# Patient Record
Sex: Female | Born: 1970 | Race: Black or African American | Hispanic: No | Marital: Married
Health system: Southern US, Community
[De-identification: ages and names within clinical notes are randomized; demographics above are authoritative.]

---

## 2000-12-06 ENCOUNTER — Other Ambulatory Visit: Admission: RE | Admit: 2000-12-06 | Discharge: 2000-12-06 | Payer: Self-pay | Admitting: Obstetrics and Gynecology

## 2001-01-29 ENCOUNTER — Ambulatory Visit (HOSPITAL_COMMUNITY): Admission: RE | Admit: 2001-01-29 | Discharge: 2001-01-29 | Payer: Self-pay | Admitting: Obstetrics and Gynecology

## 2001-01-29 ENCOUNTER — Encounter: Payer: Self-pay | Admitting: Obstetrics and Gynecology

## 2001-04-19 ENCOUNTER — Inpatient Hospital Stay (HOSPITAL_COMMUNITY): Admission: AD | Admit: 2001-04-19 | Discharge: 2001-04-19 | Payer: Self-pay | Admitting: Obstetrics and Gynecology

## 2001-07-03 ENCOUNTER — Inpatient Hospital Stay (HOSPITAL_COMMUNITY): Admission: AD | Admit: 2001-07-03 | Discharge: 2001-07-05 | Payer: Self-pay | Admitting: Obstetrics and Gynecology

## 2002-06-25 ENCOUNTER — Other Ambulatory Visit: Admission: RE | Admit: 2002-06-25 | Discharge: 2002-06-25 | Payer: Self-pay | Admitting: Obstetrics and Gynecology

## 2002-12-30 ENCOUNTER — Inpatient Hospital Stay (HOSPITAL_COMMUNITY): Admission: AD | Admit: 2002-12-30 | Discharge: 2002-12-30 | Payer: Self-pay | Admitting: Obstetrics and Gynecology

## 2003-03-19 ENCOUNTER — Inpatient Hospital Stay (HOSPITAL_COMMUNITY): Admission: AD | Admit: 2003-03-19 | Discharge: 2003-03-21 | Payer: Self-pay | Admitting: Obstetrics and Gynecology

## 2003-06-29 ENCOUNTER — Other Ambulatory Visit: Admission: RE | Admit: 2003-06-29 | Discharge: 2003-06-29 | Payer: Self-pay | Admitting: Obstetrics and Gynecology

## 2006-01-04 ENCOUNTER — Other Ambulatory Visit: Admission: RE | Admit: 2006-01-04 | Discharge: 2006-01-04 | Payer: Self-pay | Admitting: *Deleted

## 2008-01-13 ENCOUNTER — Other Ambulatory Visit: Admission: RE | Admit: 2008-01-13 | Discharge: 2008-01-13 | Payer: Self-pay | Admitting: Family Medicine

## 2009-09-27 ENCOUNTER — Other Ambulatory Visit: Admission: RE | Admit: 2009-09-27 | Discharge: 2009-09-27 | Payer: Self-pay | Admitting: Family Medicine

## 2010-05-10 ENCOUNTER — Ambulatory Visit (HOSPITAL_COMMUNITY): Admission: RE | Admit: 2010-05-10 | Discharge: 2010-05-10 | Payer: Self-pay | Admitting: Obstetrics and Gynecology

## 2010-07-25 ENCOUNTER — Ambulatory Visit (HOSPITAL_COMMUNITY): Admission: RE | Admit: 2010-07-25 | Discharge: 2010-07-25 | Payer: Self-pay | Admitting: Obstetrics and Gynecology

## 2010-08-22 ENCOUNTER — Ambulatory Visit (HOSPITAL_COMMUNITY): Admission: RE | Admit: 2010-08-22 | Discharge: 2010-08-22 | Payer: Self-pay | Admitting: Obstetrics and Gynecology

## 2010-09-18 ENCOUNTER — Inpatient Hospital Stay (HOSPITAL_COMMUNITY)
Admission: AD | Admit: 2010-09-18 | Discharge: 2010-09-18 | Payer: Self-pay | Source: Home / Self Care | Admitting: Obstetrics and Gynecology

## 2010-11-10 ENCOUNTER — Inpatient Hospital Stay (HOSPITAL_COMMUNITY)
Admission: RE | Admit: 2010-11-10 | Discharge: 2010-11-13 | Payer: Self-pay | Source: Home / Self Care | Attending: Obstetrics and Gynecology | Admitting: Obstetrics and Gynecology

## 2010-11-10 ENCOUNTER — Encounter (INDEPENDENT_AMBULATORY_CARE_PROVIDER_SITE_OTHER): Payer: Self-pay | Admitting: Obstetrics and Gynecology

## 2010-11-20 LAB — CBC
HCT: 26.9 % — ABNORMAL LOW (ref 36.0–46.0)
HCT: 26.1 % — ABNORMAL LOW (ref 36.0–46.0)
Hemoglobin: 8.8 g/dL — ABNORMAL LOW (ref 12.0–15.0)
Hemoglobin: 8.5 g/dL — ABNORMAL LOW (ref 12.0–15.0)
MCH: 29 pg (ref 26.0–34.0)
MCH: 29.2 pg (ref 26.0–34.0)
MCHC: 32.7 g/dL (ref 30.0–36.0)
MCHC: 32.6 g/dL (ref 30.0–36.0)
MCV: 88.8 fL (ref 78.0–100.0)
MCV: 89.7 fL (ref 78.0–100.0)
Platelets: 145 10*3/uL — ABNORMAL LOW (ref 150–400)
Platelets: 147 10*3/uL — ABNORMAL LOW (ref 150–400)
RBC: 3.03 MIL/uL — ABNORMAL LOW (ref 3.87–5.11)
RBC: 2.91 MIL/uL — ABNORMAL LOW (ref 3.87–5.11)
RDW: 14.5 % (ref 11.5–15.5)
RDW: 14.7 % (ref 11.5–15.5)
WBC: 12 10*3/uL — ABNORMAL HIGH (ref 4.0–10.5)
WBC: 11.9 10*3/uL — ABNORMAL HIGH (ref 4.0–10.5)

## 2010-11-20 LAB — RH IMMUNE GLOB WKUP(>/=20WKS)(NOT WOMEN'S HOSP)
Fetal Screen: NEGATIVE
Unit division: 0

## 2010-11-25 ENCOUNTER — Encounter: Payer: Self-pay | Admitting: Obstetrics and Gynecology

## 2010-11-27 NOTE — Discharge Summary (Signed)
NAMETAYELOR, OSBORNE               ACCOUNT NO.:  [id number]  MEDICAL RECORD NO.:  [id number]          PATIENT TYPE:  INP  LOCATION:  9146                          FACILITY:  [facility]  PHYSICIAN:  Naima A. Dillard, M.D. DATE OF BIRTH:  10/28/1971  DATE OF ADMISSION:  11/10/2010 DATE OF DISCHARGE:  11/13/2010                              DISCHARGE SUMMARY   ADMITTING DIAGNOSES: 1. Intrauterine pregnancy at 39-2/7 weeks. 2. Persistent breech presentation. 3. Desires tubal sterilization. 4. Borderline blood pressures. 5. Unexplained abnormal AFP.  DISCHARGE DIAGNOSES: 1. Intrauterine pregnancy at 39-2/7 weeks. 2. Breech presentation. 3. Desired permanent sterilization. 4. Anemia without hemodynamic instability.  PROCEDURES: 1. Primary low transverse cesarean section. 2. Bilateral tubal sterilization. 3. Spinal anesthesia.  HOSPITAL COURSE:  Brooke Cook is a 40 year old gravida 6, para 3-0-2-3 at 39-2/7 weeks who presented on the day of admission for scheduled primary cesarean section secondary to persistent breech presentation.  The patient also desired tubal sterilization.  Her pregnancy had been remarkable for: 1. Advanced maternal age. 2. Elevated BMI. 3. Rh negative. 4. History of pregnancy-induced hypertension with previous pregnancy. 5. First trimester vaginal bleeding. 6. Persistent breech presentation. 7. Second trimester UTI. 8. Abnormal AFP. 9. Borderline blood pressure at 35 weeks.  The patient was taken to the operating room where a repeat low transverse cesarean section was performed by Dr. Su Hilt under spinal anesthesia.  Findings were a viable female by the name of Cree, Apgars of 8 and 9, weight was 7 pounds 10 ounces.  Placenta and portions of tubes were sent to Pathology.  Infant was taken to the full-term nursery.  Mother was taken to recovery in good condition.  By postop day #1, patient was doing well.  She was up ad lib, tolerating a regular diet.  She  is having no syncope or dizziness and denied any postpartum depression.  Her hemoglobin was 8.8 down to 11.1, white blood cell count was 12, and platelet count was 153.  Her dressing was clean, dry, and intact.  She has had mild edema.  Orthostatic blood pressures were within normal limits.  The patient was breast-feeding and supplementing. She was placed on iron supplement.  Rest of her hospital stay was uncomplicated.  By postop day #3, she was doing well, up ad lib again, no syncope or dizziness.  Incision was clean, dry, and intact with Steri- Strips and subcuticular sutures were in place.  Infant was feeding well at the breast.  The patient still has 1+ edema of her lower extremities but was voiding without difficulty.  She was deemed to have received full benefit of her hospital stay and was discharged home in stable condition. Discharge instructions per [facility] handout.  DISCHARGE MEDICATIONS:  Motrin 600 mg p.o. q.6 h. p.r.n. pain, Percocet 5/325 one to two p.o. q.3-4 hours p.r.n. pain, iron supplement 325 mg one p.o. daily.  Discharge followup will occur in 6 weeks in [facility] or p.r.n.     Brooke Cook, C.N.M.   ______________________________ Pierre Bali Normand Sloop, M.D.    Leeanne Mannan  D:  11/13/2010  T:  11/13/2010  Job:  [id number]  Electronically Signed by Nigel Bridgeman C.N.M. on 11/17/2010 05:11:32 PM Electronically Signed by Jaymes Graff M.D. on 11/27/2010 02:59:30 PM

## 2011-01-15 LAB — RPR: RPR Ser Ql: NONREACTIVE

## 2011-01-15 LAB — CBC
HCT: 34 % — ABNORMAL LOW (ref 36.0–46.0)
Hemoglobin: 11.1 g/dL — ABNORMAL LOW (ref 12.0–15.0)
MCH: 29.1 pg (ref 26.0–34.0)
MCHC: 32.6 g/dL (ref 30.0–36.0)
MCV: 89 fL (ref 78.0–100.0)
Platelets: 153 10*3/uL (ref 150–400)
RBC: 3.82 MIL/uL — ABNORMAL LOW (ref 3.87–5.11)
RDW: 14.5 % (ref 11.5–15.5)
WBC: 12.1 10*3/uL — ABNORMAL HIGH (ref 4.0–10.5)

## 2011-01-15 LAB — SURGICAL PCR SCREEN
MRSA, PCR: NEGATIVE
Staphylococcus aureus: NEGATIVE

## 2011-01-16 LAB — RH IMMUNE GLOBULIN WORKUP (NOT WOMEN'S HOSP)
ABO/RH(D): O NEG
Antibody Screen: NEGATIVE
Unit division: 0

## 2011-01-21 LAB — RH IMMUNE GLOBULIN WORKUP (NOT WOMEN'S HOSP)
ABO/RH(D): O NEG
Antibody Screen: NEGATIVE

## 2011-03-23 NOTE — H&P (Signed)
[facility]  Patient:    Brooke Cook, Brooke Cook Visit Number: [id number] MRN: [id number]          Service Type: OBS Location: 910A 9129 01 Attending Physician:  Jaymes Graff A Dictated by:   Nigel Bridgeman, C.N.M. Admit Date:  07/03/2001                           History and Physical  HISTORY:                      Ms. Schweppe is a 40 year old gravida 4, para 1-0-2-1 at 85 3/7 weeks who presents for admission secondary to late decelerations on an NST in the office.  Pregnancy has been remarkable for obesity, Rh negative, history of hypertension.  PRENATAL LABORATORIES:        Blood type O-.  Rh antibody negative.  Sickle cell trait negative.  VDRL nonreactive.  Rubella titer immune.  Hepatitis B negative.  HIV nonreactive.  Pap is normal.  GC and chlamydia cultures negative.  Glucose challenge normal.  AFP normal.  Hemoglobin upon entry into practice 11.0, 11.3 at 27 weeks.  Group B Strep culture was negative at 36 weeks.  EDC of June 30, 2001 was established by last menstrual period and was in agreement with ultrasound at approximately 18 week.  HISTORY OF PRESENT PREGNANCY: Patient entered care at approximately 10 weeks. Pregnancy was essentially uncomplicated.  She did receive RhoGAM at approximately 30 weeks.  She had an ultrasound at approximately 19 weeks which showed normal growth and fluid.  PAST OBSTETRICAL HISTORY:     In 1987 she had a therapeutic termination of pregnancy at approximately [redacted] weeks gestation.  In 1991 she had a therapeutic termination of pregnancy at approximately [redacted] weeks gestation.  In 1998 he had a vaginal birth of a female infant weighing 6 pounds 11 ounces at [redacted] weeks gestation.  She was in labor approximately 17 hours.  She had an epidural anesthesia.  She had no complications.  She did receive RhoGAM after that delivery.  PAST MEDICAL HISTORY:         She is a previous condom user.  She has occasional yeast infections.   She had a history of anemia and was on iron in the past.  She has a history of elevated blood pressure, but none at present.  PAST SURGICAL HISTORY:        Two previous TABs and her wisdom teeth removed in 1989.  ALLERGIES:                    No known drug allergies.  FAMILY HISTORY:               Maternal aunt had an MI.  Mother has chronic hypertension and is on medication.  Maternal uncle also has hypertension. Maternal first cousin has seizures.  Maternal aunt has some type of cancer and is now deceased.  Maternal uncle had questionable schizophrenia.  Maternal uncle also had alcohol use.  GENETIC HISTORY:              Remarkable for the patients maternal first cousin being born with a hole in the heart.  SOCIAL HISTORY:               Patient is married.  Father of the baby is involved and supportive.  His name is Thayer Ohm.  Patient is African-American female of the [facility]  faith.  She is college educated and employed at [employer] in Clinical biochemist.  Her husband also has a Naval architect and he is also employed with [employer].  She has been followed by the physician service at [facility].  She denies any alcohol, drug, or tobacco use during this pregnancy.  PHYSICAL EXAMINATION  VITAL SIGNS:                  Stable.  Patient is afebrile.  HEENT:                        Within normal limits.  LUNGS:                        Bilateral breath sounds are clear.  HEART:                        Regular rate and rhythm without murmur.  BREASTS:                      Soft, nontender.  ABDOMEN:                      Fundal height is approximately 38 cm.  Estimated fetal weight is 7 pounds.  Uterine contractions are regular and mild.  Fetal heart rate is reactive with no decelerations.  PELVIC:                       Cervix is 1 cm in the office.  EXTREMITIES:                  Deep tendon reflexes are 2+ without clonus. There is trace edema noted.  IMPRESSION:                    1. Intrauterine pregnancy at 40 3/7 weeks.                               2. Nonreassuring fetal heart rate on NST.                               3. Negative group B Strep.                               4. Rh negative.  PLAN:                         1. Admit to birthing suite for consult with Dr.                                  Jaymes Graff as attending physician.                               2. Routine physician orders.                               3. Plan Pitocin induction.  4. Patient desires epidural as labor advances. Dictated by:   Nigel Bridgeman, C.N.M. Attending Physician:  Michael Litter DD:  07/03/01 TD:  07/03/01 Job: [id number]

## 2011-03-23 NOTE — H&P (Signed)
NAME:  Brooke Cook, Brooke Cook NO.:  [id number]   MEDICAL RECORD NO.:  [id number]                   PATIENT TYPE:   LOCATION:                                       FACILITY:   PHYSICIAN:  Naima A. Dillard, M.D.              DATE OF BIRTH:  1971/02/16   DATE OF ADMISSION:  03/19/2003  DATE OF DISCHARGE:                                HISTORY & PHYSICAL   HISTORY OF PRESENT ILLNESS:  The patient is a 40 year old, married black  female, gravida 5, para 2-0-2-2 at 39-3/[redacted] weeks gestation who presents to  the office with contractions every 5 to 10 minutes since 7 a.m.  She denies  any leaking or bleeding.  She denies any headache, nausea and vomiting, or  visual disturbances.  Her pregnancy has been followed by [facility]  OB/GYN M.D. Service and has been remarkable for (1) obesity, (2) Rh  negative, (3) history of hypertension with no medications, (4) group B strep  is negative.   LABORATORY DATA:  Her prenatal labs were collected on August 27, 2002;  hemoglobin was 10.8, hematocrit 34.4, platelets 186,000.  Blood type O  negative.  Antibody negative.  Sickle cell trait negative.  RPR nonreactive.  Rubella immune.  Hepatitis B surface antigen negative.  HIV nonreactive.  Pap smear within normal limits.  Gonorrhea negative.  Chlamydia negative.  Maternal serum alpha-fetoprotein was within normal limits.  Her one-hour  Glucola was collected on January 07, 2003, and was 27, and her RPR at that time  was nonreactive, and her hemoglobin at that time was 10.4.  Culture of the  vaginal tract for group B strep, gonorrhea, and Chlamydia on February 19, 2003,  were all negative.   HISTORY OF PRESENT PREGNANCY:  She presented for care on August 27, 2002,  at [redacted] weeks gestation.  Pertinent details of sonography at [redacted] weeks  gestation showed growth consistent with previous dating.  She was treated  for an upper respiratory infection at about [redacted] weeks gestation.  The rest  of  her prenatal care was unremarkable.   OBSTETRIC HISTORY:  1. She is a gravida 5, para 2-0-2-2.  2. In 4 and 1991, she had EABs in first trimesters with no complications.  3. In 1998, she had a vaginal delivery of a female infant weighing 6 pounds     11 ounces at [redacted] weeks gestation after 17 hours of labor; she had an     epidural for anesthesia.  Infant's name was Dorathy Daft, and there were no     complications.  4. In 2002, she had a vaginal delivery of a female infant weighing 8 pounds     3 ounces at [redacted] weeks gestation after six hours in labor, and infant's     name was [place], and there were no complications.   ALLERGIES:  She has no medication allergies.   PAST MEDICAL  HISTORY:  With pregnancy, she reports being anemic.   PAST SURGICAL HISTORY:  1. Remarkable for EAB times two.  2. Wisdom teeth extractions.   FAMILY MEDICAL HISTORY:  Remarkable for maternal aunt with an MI.  Mother  and maternal uncle and patient with history of hypertension.  Maternal aunt  with unknown type of cancer.  First cousins with seizures of unknown origin.  Maternal uncle with schizophrenia.  Maternal uncle with alcohol abuse.   GENETIC HISTORY:  Unremarkable.   SOCIAL HISTORY:  She is married to [place]; he is the father of the baby.  He  is involved and supportive.  They are of [employer].  They deny any  alcohol, tobacco, or illicit drug use with the pregnancy.   OBJECTIVE DATA:  VITAL SIGNS:  Her blood pressure is elevated at 130/96; her  other vital signs are stable.  She is afebrile.  HEENT:  Grossly within normal limits.  CHEST:  Clear to auscultation.  HEART:  Regular rate and rhythm.  ABDOMEN:  Gravid and contour with fundal height extending approximately 40  cm to pubic symphysis.  Fetal heart rate is dopplered at 150.  Uterine  contractions are approximately every 5 minutes.  EXTREMITIES:  Within normal limits.  PELVIC EXAM:  Cervix is 4 cm, 80%, and vertex -2, confirmed by  ultrasound.   ASSESSMENT:  1. Intrauterine pregnancy at term.  2. Early active labor.  3. Group B Streptococcus negative.   PLAN:  1. To admit to [facility] for consult with Dr. Normand Sloop.  2. Routine M.D. orders.  3. Anticipate normal spontaneous vaginal birth.     Cam Hai, C.N.M.                     Naima A. Normand Sloop, M.D.    KS/MEDQ  D:  03/19/2003  T:  03/19/2003  Job:  [id number]

## 2013-08-14 ENCOUNTER — Other Ambulatory Visit (HOSPITAL_BASED_OUTPATIENT_CLINIC_OR_DEPARTMENT_OTHER): Payer: Self-pay | Admitting: Family Medicine

## 2013-08-14 DIAGNOSIS — Z1231 Encounter for screening mammogram for malignant neoplasm of breast: Secondary | ICD-10-CM

## 2013-08-19 ENCOUNTER — Ambulatory Visit (HOSPITAL_BASED_OUTPATIENT_CLINIC_OR_DEPARTMENT_OTHER)
Admission: RE | Admit: 2013-08-19 | Discharge: 2013-08-19 | Disposition: A | Discharge status: Home / Self Care | Payer: Managed Care, Other (non HMO) | Source: Ambulatory Visit | Attending: Family Medicine | Admitting: Family Medicine

## 2013-08-19 DIAGNOSIS — Z1231 Encounter for screening mammogram for malignant neoplasm of breast: Secondary | ICD-10-CM | POA: Insufficient documentation

## 2017-01-21 ENCOUNTER — Other Ambulatory Visit: Payer: Self-pay | Admitting: Physician Assistant

## 2017-01-21 ENCOUNTER — Other Ambulatory Visit (HOSPITAL_COMMUNITY)
Admission: RE | Admit: 2017-01-21 | Discharge: 2017-01-21 | Disposition: A | Discharge status: Home / Self Care | Payer: 59 | Source: Ambulatory Visit | Attending: Physician Assistant | Admitting: Physician Assistant

## 2017-01-21 DIAGNOSIS — Z1151 Encounter for screening for human papillomavirus (HPV): Secondary | ICD-10-CM | POA: Insufficient documentation

## 2017-01-21 DIAGNOSIS — Z0000 Encounter for general adult medical examination without abnormal findings: Secondary | ICD-10-CM | POA: Insufficient documentation

## 2017-01-23 LAB — CYTOLOGY - PAP
Adequacy: ABSENT
Diagnosis: NEGATIVE
HPV: NOT DETECTED

## 2018-06-24 DIAGNOSIS — S91012A Laceration without foreign body, left ankle, initial encounter: Secondary | ICD-10-CM | POA: Diagnosis not present

## 2018-06-24 DIAGNOSIS — H40013 Open angle with borderline findings, low risk, bilateral: Secondary | ICD-10-CM | POA: Diagnosis not present

## 2018-07-08 DIAGNOSIS — Z23 Encounter for immunization: Secondary | ICD-10-CM | POA: Diagnosis not present

## 2018-07-08 DIAGNOSIS — S91012D Laceration without foreign body, left ankle, subsequent encounter: Secondary | ICD-10-CM | POA: Diagnosis not present

## 2018-07-08 DIAGNOSIS — I10 Essential (primary) hypertension: Secondary | ICD-10-CM | POA: Diagnosis not present

## 2018-07-08 DIAGNOSIS — Z4802 Encounter for removal of sutures: Secondary | ICD-10-CM | POA: Diagnosis not present

## 2019-01-16 ENCOUNTER — Other Ambulatory Visit: Payer: Self-pay | Admitting: Family Medicine

## 2019-01-16 DIAGNOSIS — Z1231 Encounter for screening mammogram for malignant neoplasm of breast: Secondary | ICD-10-CM

## 2019-05-07 ENCOUNTER — Other Ambulatory Visit: Payer: Self-pay

## 2019-05-07 ENCOUNTER — Ambulatory Visit
Admission: RE | Admit: 2019-05-07 | Discharge: 2019-05-07 | Disposition: A | Discharge status: Home / Self Care | Payer: 59 | Source: Ambulatory Visit | Attending: Family Medicine | Admitting: Family Medicine

## 2019-05-07 DIAGNOSIS — Z1231 Encounter for screening mammogram for malignant neoplasm of breast: Secondary | ICD-10-CM

## 2021-05-31 ENCOUNTER — Ambulatory Visit (INDEPENDENT_AMBULATORY_CARE_PROVIDER_SITE_OTHER): Payer: BC Managed Care – PPO

## 2021-05-31 ENCOUNTER — Ambulatory Visit: Payer: BC Managed Care – PPO | Admitting: Podiatry

## 2021-05-31 ENCOUNTER — Other Ambulatory Visit: Payer: Self-pay

## 2021-05-31 ENCOUNTER — Encounter: Payer: Self-pay | Admitting: Podiatry

## 2021-05-31 DIAGNOSIS — M21619 Bunion of unspecified foot: Secondary | ICD-10-CM

## 2021-05-31 DIAGNOSIS — M21621 Bunionette of right foot: Secondary | ICD-10-CM | POA: Diagnosis not present

## 2021-05-31 DIAGNOSIS — M21622 Bunionette of left foot: Secondary | ICD-10-CM | POA: Diagnosis not present

## 2021-05-31 DIAGNOSIS — M21612 Bunion of left foot: Secondary | ICD-10-CM

## 2021-05-31 DIAGNOSIS — M21611 Bunion of right foot: Secondary | ICD-10-CM

## 2021-05-31 NOTE — Patient Instructions (Signed)

## 2021-05-31 NOTE — Progress Notes (Signed)
Subjective:   Patient ID: Brooke Cook, female   DOB: 50 y.o.   MRN: [id number]   HPI Patient presents with chronic bunion deformity left is very tender with shoe gear and states its been present for a while worsening over the last 6 months and that she is tried wider shoes and has tried soaks oral anti-inflammatories without relief along with pain in the outside of the metatarsal left over right.  Patient is found to have good health overall does not smoke likes to be active   Review of Systems  All other systems reviewed and are negative.      Objective:  Physical Exam Vitals and nursing note reviewed.  Constitutional:      Appearance: She is well-developed.  Pulmonary:     Effort: Pulmonary effort is normal.  Musculoskeletal:        General: Normal range of motion.  Skin:    General: Skin is warm.  Neurological:     Mental Status: She is alert.    Neurovascular status intact muscle strength adequate range of motion within normal limits with patient noted to have large hyperostosis medial aspect first metatarsal head left over right and prominence of the fifth metatarsal head left over right foot with redness around the first metatarsal and fifth metatarsal.  Patient is found to have good digit perfusion good range of motion and good range of motion of the first MPJ bilateral no crepitus of the joint was noted.     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  I educated her on the procedure is necessary and recovery and she wants to have this done but would rather get closer to the end of the year and we will schedule her for consult approximately 2 weeks before surgical date.  All  questions answered and encouraged her to write questions down for the future  X-rays indicate that there is elevation of the intermetatarsal angle with deviation of the hallux bilateral against the second toe with elevation of the 4 5 intermetatarsal angle bilateral

## 2021-06-28 ENCOUNTER — Other Ambulatory Visit: Payer: Self-pay | Admitting: Physician Assistant

## 2021-06-28 DIAGNOSIS — Z1231 Encounter for screening mammogram for malignant neoplasm of breast: Secondary | ICD-10-CM

## 2021-07-05 ENCOUNTER — Ambulatory Visit
Admission: RE | Admit: 2021-07-05 | Discharge: 2021-07-05 | Disposition: A | Discharge status: Home / Self Care | Payer: BC Managed Care – PPO | Source: Ambulatory Visit | Attending: Physician Assistant | Admitting: Physician Assistant

## 2021-07-05 ENCOUNTER — Other Ambulatory Visit: Payer: Self-pay

## 2021-07-05 DIAGNOSIS — Z1231 Encounter for screening mammogram for malignant neoplasm of breast: Secondary | ICD-10-CM

## 2021-10-02 ENCOUNTER — Encounter: Payer: Self-pay | Admitting: Podiatry

## 2021-10-02 ENCOUNTER — Ambulatory Visit: Payer: BC Managed Care – PPO | Admitting: Podiatry

## 2021-10-02 ENCOUNTER — Other Ambulatory Visit: Payer: Self-pay

## 2021-10-02 DIAGNOSIS — M21622 Bunionette of left foot: Secondary | ICD-10-CM

## 2021-10-02 DIAGNOSIS — M21619 Bunion of unspecified foot: Secondary | ICD-10-CM

## 2021-10-02 DIAGNOSIS — M21621 Bunionette of right foot: Secondary | ICD-10-CM | POA: Diagnosis not present

## 2021-10-02 NOTE — Progress Notes (Signed)
Subjective:   Patient ID: Brooke Cook, female   DOB: 50 y.o.   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  I allowed them to review the x-rays and consent form going over alternative treatments complications and after extensive review patient signed consent form and is given all preoperative instructions.  Patient is encouraged to call with questions concerns which may arise and everything was answered and cam walker dispensed today that I want her to get used to prior to surgery.  She understands that she will be in some form of immobilization 4 to 6 weeks and that total recovery period can take approximately 6 months.  Patient is scheduled for outpatient surgery and encouraged to call questions concerns

## 2021-10-10 ENCOUNTER — Telehealth: Payer: Self-pay | Admitting: Urology

## 2021-10-10 NOTE — Telephone Encounter (Signed)
DOS - 10/24/21  [place] BUNIONECTOMY LEFT ---- [id number] METATARSAL OSTEOTOMY 5TH LEFT --- [id number]   BCBS EFFECTIVE DATE - 01/03/21   NO PRIOR AUTH IS REQUIRED FOR STATE BCBS

## 2021-10-23 MED ORDER — OXYCODONE-ACETAMINOPHEN 10-325 MG PO TABS: 1.0000 | ORAL_TABLET | ORAL | 0 refills | Status: AC | PRN

## 2021-10-23 MED ORDER — ONDANSETRON HCL 4 MG PO TABS: 4.0000 mg | ORAL_TABLET | Freq: Three times a day (TID) | ORAL | 0 refills | Status: AC | PRN

## 2021-10-23 NOTE — Addendum Note (Signed)
Addended by: Lenn Sink on: 10/23/2021 01:30 PM   Modules accepted: Orders

## 2021-10-24 ENCOUNTER — Encounter: Payer: Self-pay | Admitting: Podiatry

## 2021-10-24 DIAGNOSIS — M2012 Hallux valgus (acquired), left foot: Secondary | ICD-10-CM

## 2021-11-01 ENCOUNTER — Ambulatory Visit (INDEPENDENT_AMBULATORY_CARE_PROVIDER_SITE_OTHER): Payer: BC Managed Care – PPO

## 2021-11-01 ENCOUNTER — Other Ambulatory Visit: Payer: Self-pay

## 2021-11-01 ENCOUNTER — Encounter: Payer: Self-pay | Admitting: Podiatry

## 2021-11-01 ENCOUNTER — Ambulatory Visit (INDEPENDENT_AMBULATORY_CARE_PROVIDER_SITE_OTHER): Payer: BC Managed Care – PPO | Admitting: Podiatry

## 2021-11-01 DIAGNOSIS — Z9889 Other specified postprocedural states: Secondary | ICD-10-CM

## 2021-11-01 DIAGNOSIS — M21619 Bunion of unspecified foot: Secondary | ICD-10-CM

## 2021-11-01 DIAGNOSIS — M21622 Bunionette of left foot: Secondary | ICD-10-CM

## 2021-11-01 DIAGNOSIS — M21621 Bunionette of right foot: Secondary | ICD-10-CM

## 2021-11-01 NOTE — Progress Notes (Signed)
Subjective:   Patient ID: Brooke Cook, female   DOB: 50 y.o.   MRN: [id number]   HPI Patient presents stating that she is doing well with surgery and very pleased so far   ROS      Objective:  Physical Exam  Neurovascular status intact negative Denna Haggard' sign noted left foot healing well wound edges coapted well hallux rectus position fifth metatarsal good alignment noted     Assessment:  Doing well post forefoot reconstruction left     Plan:  H&P reviewed condition and x-rays.  Reapplied sterile dressing continue immobilization elevation compression and reappoint in the next 3 to 4 weeks and we will continue to treat this conservatively at this time  X-rays indicate that the osteotomies are healing well fixation in place alignment good

## 2021-11-29 ENCOUNTER — Ambulatory Visit (INDEPENDENT_AMBULATORY_CARE_PROVIDER_SITE_OTHER): Payer: BC Managed Care – PPO | Admitting: Podiatry

## 2021-11-29 ENCOUNTER — Encounter: Payer: Self-pay | Admitting: Podiatry

## 2021-11-29 ENCOUNTER — Ambulatory Visit (INDEPENDENT_AMBULATORY_CARE_PROVIDER_SITE_OTHER): Payer: BC Managed Care – PPO

## 2021-11-29 ENCOUNTER — Other Ambulatory Visit: Payer: Self-pay

## 2021-11-29 DIAGNOSIS — Z9889 Other specified postprocedural states: Secondary | ICD-10-CM | POA: Diagnosis not present

## 2021-11-29 NOTE — Progress Notes (Signed)
Subjective:   Patient ID: Brooke Cook, female   DOB: 51 y.o.   MRN: [id number]   HPI Patient states doing very well with surgery and very pleased so far   ROS      Objective:  Physical Exam  Neurovascular status intact negative Denna Haggard' sign noted patient's left foot healing well wound edges well coapted hallux in rectus position good range of motion first MPJ mild swelling around the fifth MPJ not significant      Assessment:  Overall doing well with forefoot reconstruction of the left     Plan:  H&P reviewed condition and discussed the fifth MPJ which does have some movement but it should heal uneventfully with secondary intent.  Patient will continue with immobilization for several more weeks and gradual return to tennis shoes and not go barefoot or do any types of activities which would be traumatic and will be seen back 4 weeks or earlier if needed  X-rays indicate first osteotomy healing very well good alignment good fixation moderate medial rotation of the fifth MPJ left but it should heal uneventfully at this position

## 2022-01-10 ENCOUNTER — Encounter: Payer: Self-pay | Admitting: Podiatry

## 2022-01-10 ENCOUNTER — Ambulatory Visit (INDEPENDENT_AMBULATORY_CARE_PROVIDER_SITE_OTHER): Payer: BC Managed Care – PPO | Admitting: Podiatry

## 2022-01-10 ENCOUNTER — Other Ambulatory Visit: Payer: Self-pay

## 2022-01-10 ENCOUNTER — Ambulatory Visit (INDEPENDENT_AMBULATORY_CARE_PROVIDER_SITE_OTHER): Payer: BC Managed Care – PPO

## 2022-01-10 DIAGNOSIS — Z9889 Other specified postprocedural states: Secondary | ICD-10-CM

## 2022-01-10 NOTE — Progress Notes (Signed)
Subjective:  ? ?Patient ID: Brooke Cook, female   DOB: 51 y.o.   MRN: [id number]  ? ?HPI ?Patient presents stating she is doing much better only has minimal discomfort and swelling if she is on her foot for too long ? ? ?ROS ? ? ?   ?Objective:  ?Physical Exam  ?Neuro vascular status intact negative Denna Haggard' sign noted wound edges left healing well clinically everything looks good range of motion adequate no crepitus of the joint mild restriction first MPJ ? ?   ?Assessment:  ?Overall doing well did have some movement around the fifth metatarsal but it is healing uneventfully with minimal clinical symptoms ? ?   ?Plan:  ?Reviewed x-ray allowed patient to continue range of motion exercises gradually increase activity levels at this time and encouraged return to normal shoe gear.  Patient will be seen back as needed ? ?X-rays indicate osteotomies are healing well fifth metatarsal did move in a medial direction but healed well there is a spicule on the lateral side but I do not think it will be symptomatic and ultimately may need to be smoothed off but I do not see that necessary at the current time with all fixation in place ?   ? ? ?

## 2022-05-10 ENCOUNTER — Other Ambulatory Visit: Payer: Self-pay | Admitting: Family Medicine

## 2022-05-10 DIAGNOSIS — Z1231 Encounter for screening mammogram for malignant neoplasm of breast: Secondary | ICD-10-CM

## 2023-02-09 IMAGING — MG MM DIGITAL SCREENING BILAT W/ TOMO AND CAD
8 of 14 series · 8 of 40 positions shown · non-contrast
Comparison: Previous exam(s).

CLINICAL DATA: Screening.

EXAM:
DIGITAL SCREENING BILATERAL MAMMOGRAM WITH TOMOSYNTHESIS AND CAD
TECHNIQUE: Bilateral screening digital craniocaudal and mediolateral oblique
mammograms were obtained. Bilateral screening digital breast
tomosynthesis was performed. The images were evaluated with
computer-aided detection.

[L MLO synth-2D (1 of 2)]
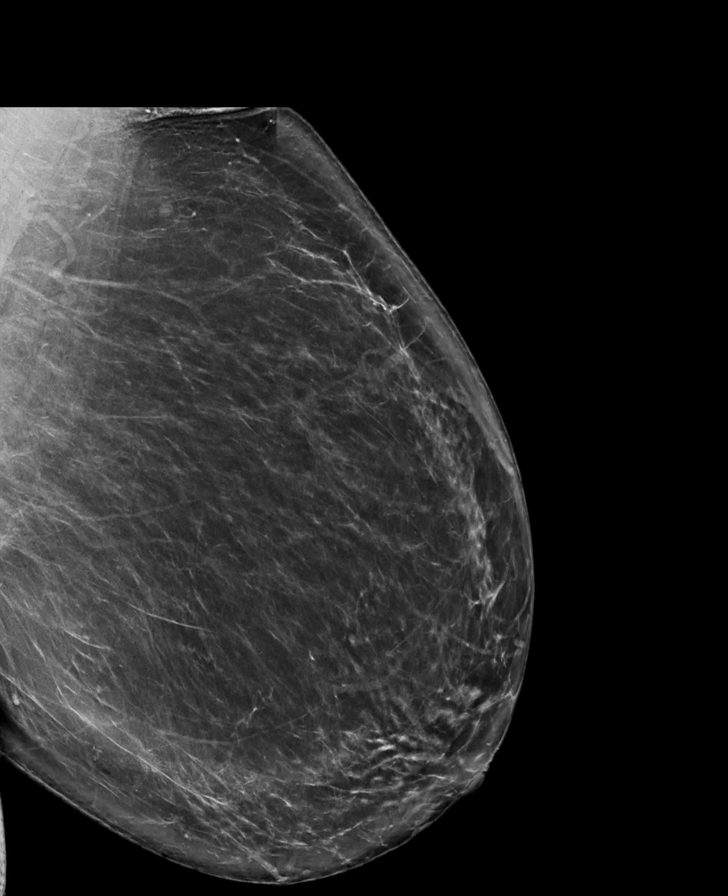

[R CC synth-2D]
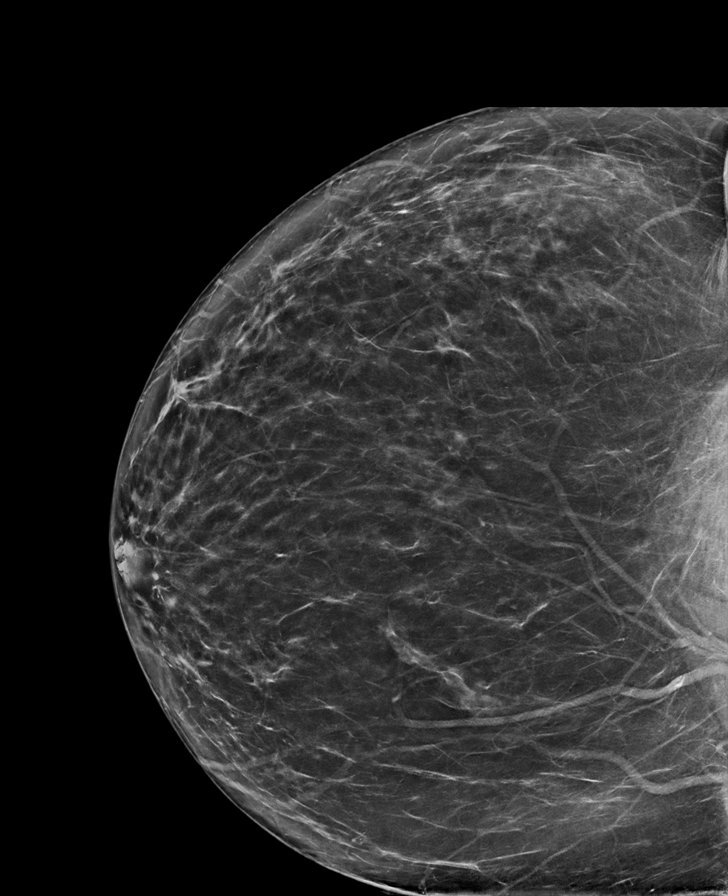

[L MLO synth-2D (2 of 2)]
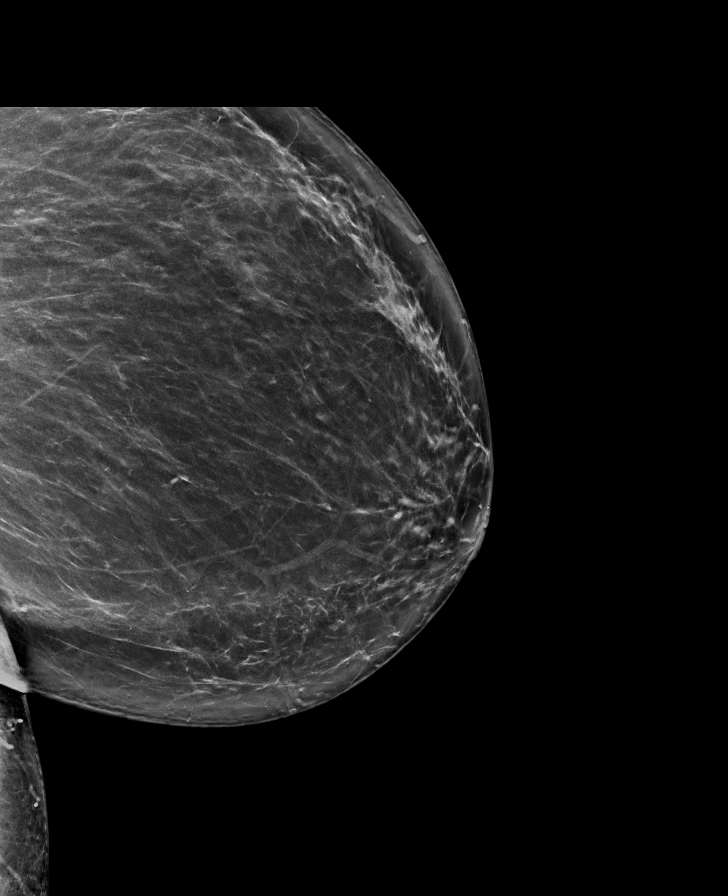

[R MLO synth-2D (1 of 2)]
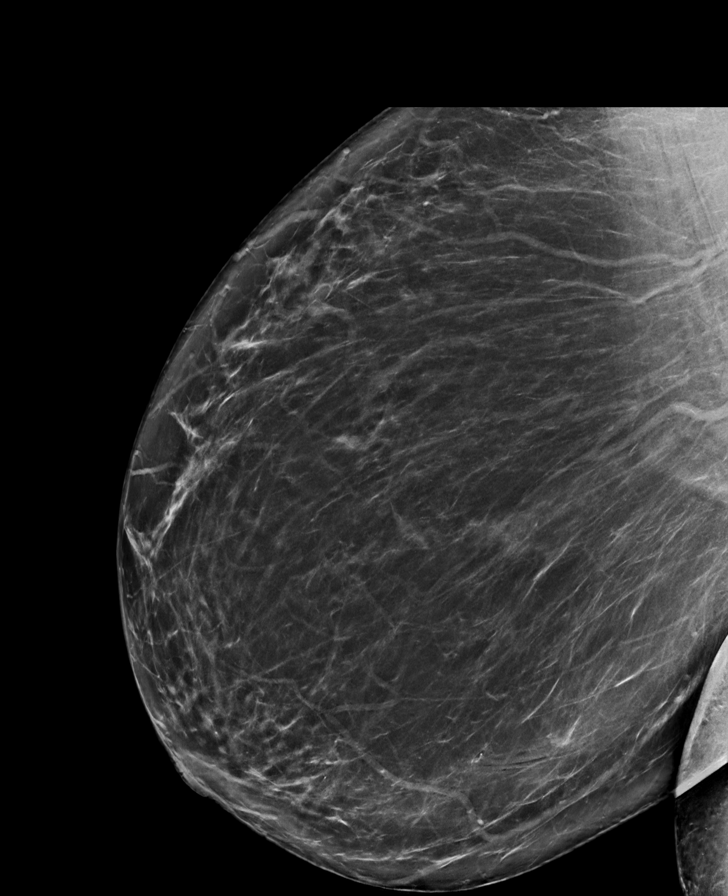

[R CV synth-2D]
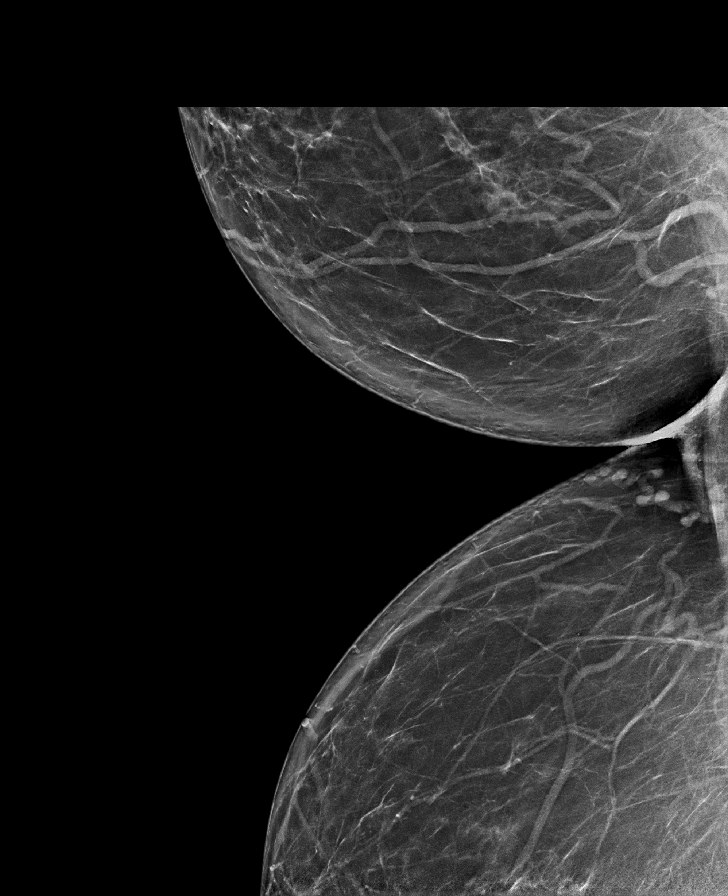

[L CC synth-2D]
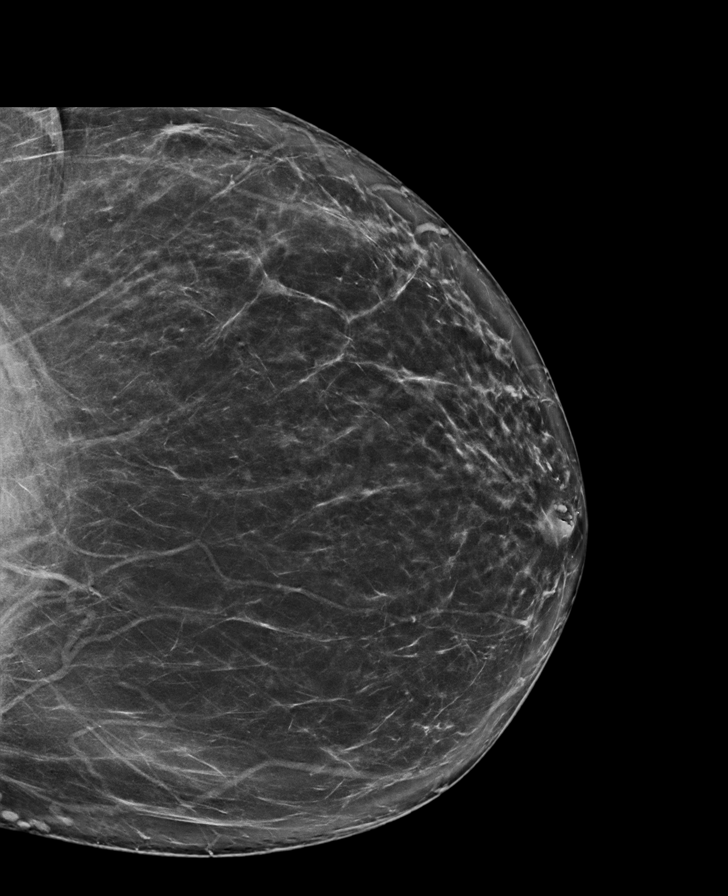

[R MLO synth-2D (2 of 2)]
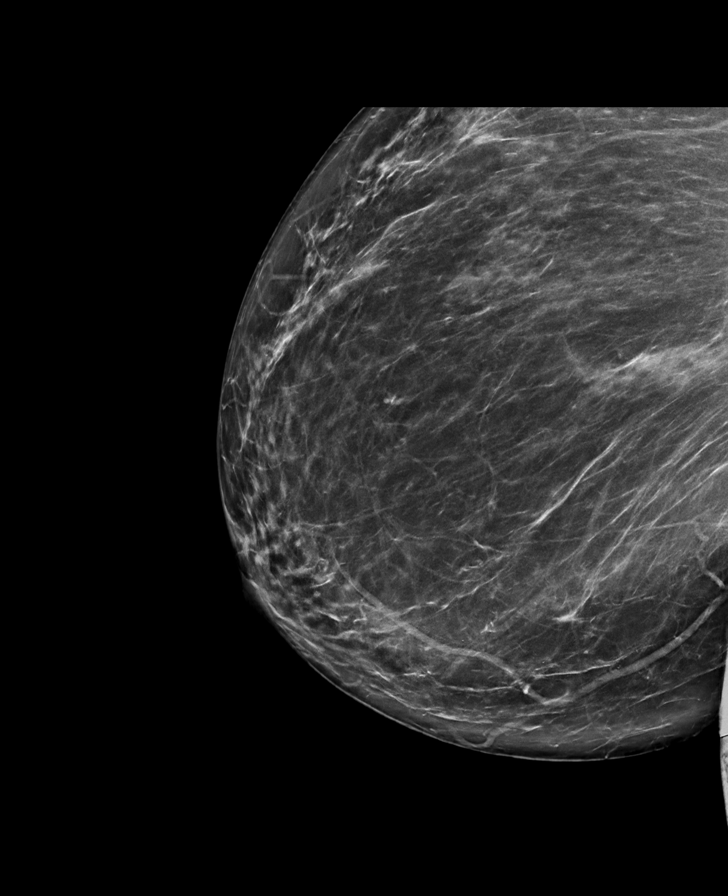

[R MLO tomo · tomo slice 42/83.0]
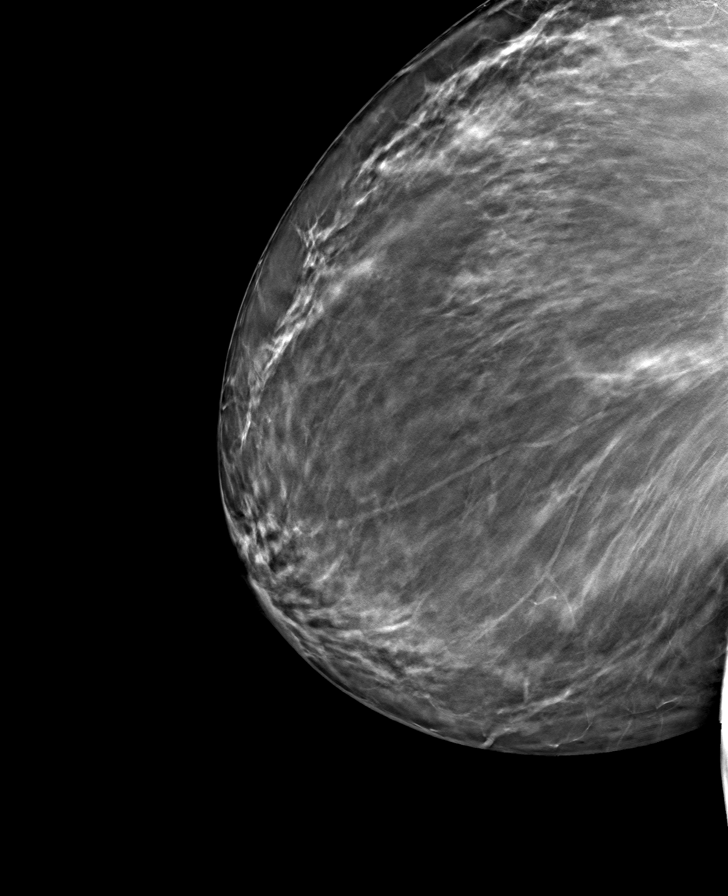

[8 of 40 positions shown; findings below may reference images not displayed]

ACR Breast Density Category b: There are scattered areas of
fibroglandular density.
FINDINGS: There are no findings suspicious for malignancy.
IMPRESSION: No mammographic evidence of malignancy. A result letter of this
screening mammogram will be mailed directly to the patient.

RECOMMENDATION:
Screening mammogram in one year. (Code:51-O-LD2)

BI-RADS CATEGORY  1: Negative.
